# Patient Record
Sex: Female | Born: 1965 | Race: White | Marital: Single | Smoking: Former smoker
Health system: Southern US, Community
[De-identification: ages and names within clinical notes are randomized; demographics above are authoritative.]

## PROBLEM LIST (undated history)

## (undated) DIAGNOSIS — F329 Major depressive disorder, single episode, unspecified: Secondary | ICD-10-CM

## (undated) DIAGNOSIS — F419 Anxiety disorder, unspecified: Secondary | ICD-10-CM

## (undated) DIAGNOSIS — R011 Cardiac murmur, unspecified: Secondary | ICD-10-CM

## (undated) DIAGNOSIS — F32A Depression, unspecified: Secondary | ICD-10-CM

## (undated) DIAGNOSIS — M1990 Unspecified osteoarthritis, unspecified site: Secondary | ICD-10-CM

## (undated) HISTORY — PX: OTHER SURGICAL HISTORY: SHX169

## (undated) HISTORY — PX: WISDOM TOOTH EXTRACTION: SHX21

## (undated) HISTORY — DX: Unspecified osteoarthritis, unspecified site: M19.90

## (undated) HISTORY — DX: Cardiac murmur, unspecified: R01.1

## (undated) HISTORY — DX: Depression, unspecified: F32.A

## (undated) HISTORY — DX: Anxiety disorder, unspecified: F41.9

---
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
## 2005-11-17 HISTORY — PX: AUGMENTATION MAMMAPLASTY: SUR837

## 2011-01-27 ENCOUNTER — Other Ambulatory Visit: Payer: Self-pay | Admitting: Internal Medicine

## 2011-01-27 DIAGNOSIS — Z1231 Encounter for screening mammogram for malignant neoplasm of breast: Secondary | ICD-10-CM

## 2011-01-31 ENCOUNTER — Ambulatory Visit
Admission: RE | Admit: 2011-01-31 | Discharge: 2011-01-31 | Disposition: A | Discharge status: Home / Self Care | Payer: 59 | Source: Ambulatory Visit | Attending: Internal Medicine | Admitting: Internal Medicine

## 2011-01-31 DIAGNOSIS — Z1231 Encounter for screening mammogram for malignant neoplasm of breast: Secondary | ICD-10-CM

## 2011-02-04 ENCOUNTER — Ambulatory Visit: Payer: Self-pay

## 2017-09-01 ENCOUNTER — Other Ambulatory Visit: Payer: Self-pay | Admitting: Nurse Practitioner

## 2017-09-01 DIAGNOSIS — Z1231 Encounter for screening mammogram for malignant neoplasm of breast: Secondary | ICD-10-CM

## 2017-09-18 ENCOUNTER — Ambulatory Visit: Payer: Self-pay

## 2017-11-17 HISTORY — PX: OTHER SURGICAL HISTORY: SHX169

## 2019-04-21 ENCOUNTER — Encounter: Payer: Self-pay | Admitting: Gastroenterology

## 2019-05-05 ENCOUNTER — Ambulatory Visit: Payer: 59

## 2019-05-05 ENCOUNTER — Other Ambulatory Visit: Payer: Self-pay

## 2019-05-05 VITALS — Ht 67.0 in | Wt 132.0 lb

## 2019-05-05 DIAGNOSIS — Z1211 Encounter for screening for malignant neoplasm of colon: Secondary | ICD-10-CM

## 2019-05-05 MED ORDER — NA SULFATE-K SULFATE-MG SULF 17.5-3.13-1.6 GM/177ML PO SOLN: 1.0000 | Freq: Once | ORAL | 0 refills | Status: AC

## 2019-05-05 NOTE — Progress Notes (Signed)
No egg or soy allergy known to patient  No issues with past sedation with any surgeries  or procedures, no intubation problems  No diet pills per patient No home 02 use per patient  No blood thinners per patient  Pt denies issues with constipation  No A fib or A flutter  EMMI video sent to pt's e mail  

## 2019-05-13 ENCOUNTER — Other Ambulatory Visit: Payer: Self-pay | Admitting: Internal Medicine

## 2019-05-13 DIAGNOSIS — Z1231 Encounter for screening mammogram for malignant neoplasm of breast: Secondary | ICD-10-CM

## 2019-05-19 ENCOUNTER — Encounter: Payer: 59 | Admitting: Gastroenterology

## 2019-05-24 ENCOUNTER — Encounter: Payer: Self-pay | Admitting: Gastroenterology

## 2019-05-30 ENCOUNTER — Telehealth: Payer: Self-pay | Admitting: Gastroenterology

## 2019-05-30 NOTE — Telephone Encounter (Signed)

## 2019-05-31 ENCOUNTER — Other Ambulatory Visit: Payer: Self-pay

## 2019-05-31 ENCOUNTER — Ambulatory Visit (AMBULATORY_SURGERY_CENTER): Payer: 59 | Admitting: Gastroenterology

## 2019-05-31 ENCOUNTER — Encounter: Payer: Self-pay | Admitting: Gastroenterology

## 2019-05-31 VITALS — BP 120/87 | HR 66 | Temp 99.1°F | Resp 16 | Ht 67.0 in | Wt 132.0 lb

## 2019-05-31 DIAGNOSIS — Z1211 Encounter for screening for malignant neoplasm of colon: Secondary | ICD-10-CM | POA: Diagnosis present

## 2019-05-31 MED ORDER — SODIUM CHLORIDE 0.9 % IV SOLN: 500.0000 mL | Freq: Once | INTRAVENOUS | Status: DC

## 2019-05-31 NOTE — Op Note (Signed)
[facility] Patient Name: Whitney Mosley Key Procedure Date: 05/31/2019 7:35 AM MRN: [id number] Endoscopist: Lynann Bolognaajesh Kirstein Baxley , MD Age: 53 Referring MD:  Date of Birth: 1966-11-13 Gender: Female Account #: [id number] Procedure:                Colonoscopy Indications:              Screening for colorectal malignant neoplasm Medicines:                Monitored Anesthesia Care Procedure:                Pre-Anesthesia Assessment:                           - Prior to the procedure, a History and Physical                            was performed, and patient medications and                            allergies were reviewed. The patient's tolerance of                            previous anesthesia was also reviewed. The risks                            and benefits of the procedure and the sedation                            options and risks were discussed with the patient.                            All questions were answered, and informed consent                            was obtained. Prior Anticoagulants: The patient has                            taken no previous anticoagulant or antiplatelet                            agents. ASA Grade Assessment: II - A patient with                            mild systemic disease. After reviewing the risks                            and benefits, the patient was deemed in                            satisfactory condition to undergo the procedure.                           After obtaining informed consent, the colonoscope  was passed under direct vision. Throughout the                            procedure, the patient's blood pressure, pulse, and                            oxygen saturations were monitored continuously. The                            Colonoscope was introduced through the anus and                            advanced to the 2 cm into the ileum. The                            colonoscopy was performed  without difficulty. The                            patient tolerated the procedure well. The quality                            of the bowel preparation was good. The terminal                            ileum, ileocecal valve, appendiceal orifice, and                            rectum were photographed. Scope In: 7:45:01 AM Scope Out: 7:58:58 AM Scope Withdrawal Time: 0 hours 8 minutes 50 seconds  Total Procedure Duration: 0 hours 13 minutes 57 seconds  Findings:                 A few small-mouthed diverticula were found in the                            sigmoid colon and ascending colon. There was no                            evidence of diverticular bleeding.                           Non-bleeding internal hemorrhoids were found during                            retroflexion. The hemorrhoids were small.                           The terminal ileum appeared normal.                           The exam was otherwise without abnormality on                            direct and retroflexion views. Complications:  No immediate complications. Estimated Blood Loss:     Estimated blood loss: none. Impression:               -Mild pancolonic diverticulosis.                           -Otherwise normal colonoscopy to TI. Recommendation:           - Patient has a contact number available for                            emergencies. The signs and symptoms of potential                            delayed complications were discussed with the                            patient. Return to normal activities tomorrow.                            Written discharge instructions were provided to the                            patient.                           - Resume previous diet.                           - Continue present medications.                           - Repeat colonoscopy in 10 years for screening                            purposes. Earlier, if with any new problems or if                             there is any change in family history.                           - Return to GI clinic PRN. Lynann Bolognaajesh Miel Wisener, MD 05/31/2019 8:04:54 AM This report has been signed electronically.

## 2019-05-31 NOTE — Progress Notes (Signed)
To PACU, VSS. Report to [email or web address] 

## 2019-05-31 NOTE — Progress Notes (Addendum)
No problems noted in the recovery room. Maw  Pt had 7:30 procedure and her boyfriend was the 8:00 procedure.  Same driver. maw

## 2019-05-31 NOTE — Progress Notes (Signed)
[place]  Pt's states no medical or surgical changes since previsit or office visit.  Pt states propofol makes her vomit

## 2019-05-31 NOTE — Patient Instructions (Signed)
YOU HAD AN ENDOSCOPIC PROCEDURE TODAY AT [facility]:   Refer to the procedure report that was given to you for any specific questions about what was found during the examination.  If the procedure report does not answer your questions, please call your gastroenterologist to clarify.  If you requested that your care partner not be given the details of your procedure findings, then the procedure report has been included in a sealed envelope for you to review at your convenience later.  YOU SHOULD EXPECT: Some feelings of bloating in the abdomen. Passage of more gas than usual.  Walking can help get rid of the air that was put into your GI tract during the procedure and reduce the bloating. If you had a lower endoscopy (such as a colonoscopy or flexible sigmoidoscopy) you may notice spotting of blood in your stool or on the toilet paper. If you underwent a bowel prep for your procedure, you may not have a normal bowel movement for a few days.  Please Note:  You might notice some irritation and congestion in your nose or some drainage.  This is from the oxygen used during your procedure.  There is no need for concern and it should clear up in a day or so.  SYMPTOMS TO REPORT IMMEDIATELY:   Following lower endoscopy (colonoscopy or flexible sigmoidoscopy):  Excessive amounts of blood in the stool  Significant tenderness or worsening of abdominal pains  Swelling of the abdomen that is new, acute  Fever of 100F or higher   For urgent or emergent issues, a gastroenterologist can be reached at any hour by calling [phone].   DIET:  We do recommend a small meal at first, but then you may proceed to your regular diet.  Drink plenty of fluids but you should avoid alcoholic beverages for 24 hours.  ACTIVITY:  You should plan to take it easy for the rest of today and you should NOT DRIVE or use heavy machinery until tomorrow (because of the sedation medicines used during the test).     FOLLOW UP: Our staff will call the number listed on your records 48-72 hours following your procedure to check on you and address any questions or concerns that you may have regarding the information given to you following your procedure. If we do not reach you, we will leave a message.  We will attempt to reach you two times.  During this call, we will ask if you have developed any symptoms of COVID 19. If you develop any symptoms (ie: fever, flu-like symptoms, shortness of breath, cough etc.) before then, please call [phone].  If you test positive for Covid 19 in the 2 weeks post procedure, please call and report this information to [place].    If any biopsies were taken you will be contacted by phone or by letter within the next 1-3 weeks.  Please call us at [phone] if you have not heard about the biopsies in 3 weeks.    SIGNATURES/CONFIDENTIALITY: You and/or your care partner have signed paperwork which will be entered into your electronic medical record.  These signatures attest to the fact that that the information above on your After Visit Summary has been reviewed and is understood.  Full responsibility of the confidentiality of this discharge information lies with you and/or your care-partner.    Handouts were given to your care partner on diverticulosis and hemorrhoids. You may resume your current medications today. Repeat screening colonoscopy in 10 years.  Please call if any questions or concerns.

## 2019-06-02 ENCOUNTER — Telehealth: Payer: Self-pay | Admitting: *Deleted

## 2019-06-02 NOTE — Telephone Encounter (Signed)
  Follow up Call-  Call back number 05/31/2019  Post procedure Call Back phone  # [phone]  Permission to leave phone message Yes  Some recent data might be hidden     Patient questions:  Do you have a fever, pain , or abdominal swelling? No. Pain Score  0 *  Have you tolerated food without any problems? Yes.    Have you been able to return to your normal activities? Yes.    Do you have any questions about your discharge instructions: Diet   No. Medications  No. Follow up visit  No.  Do you have questions or concerns about your Care? No.  Actions: * If pain score is 4 or above: No action needed, pain <4.  1. Have you developed a fever since your procedure? no  2.   Have you had an respiratory symptoms (SOB or cough) since your procedure? no  3.   Have you tested positive for COVID 19 since your procedure no  4.   Have you had any family members/close contacts diagnosed with the COVID 19 since your procedure?  no   If yes to any of these questions please route to Joylene John, RN and Alphonsa Gin, Therapist, sports.

## 2019-06-02 NOTE — Telephone Encounter (Signed)
  Follow up Call-  Call back number 05/31/2019  Post procedure Call Back phone  # [phone]  Permission to leave phone message Yes  Some recent data might be hidden     Patient questions:  Message left to call us if necessary.

## 2019-06-28 ENCOUNTER — Other Ambulatory Visit: Payer: Self-pay

## 2019-06-28 ENCOUNTER — Ambulatory Visit
Admission: RE | Admit: 2019-06-28 | Discharge: 2019-06-28 | Disposition: A | Discharge status: Home / Self Care | Payer: 59 | Source: Ambulatory Visit | Attending: Internal Medicine | Admitting: Internal Medicine

## 2019-06-28 DIAGNOSIS — Z1231 Encounter for screening mammogram for malignant neoplasm of breast: Secondary | ICD-10-CM

## 2020-05-16 ENCOUNTER — Other Ambulatory Visit: Payer: Self-pay | Admitting: Internal Medicine

## 2020-05-16 DIAGNOSIS — Z1231 Encounter for screening mammogram for malignant neoplasm of breast: Secondary | ICD-10-CM

## 2020-07-06 ENCOUNTER — Ambulatory Visit
Admission: RE | Admit: 2020-07-06 | Discharge: 2020-07-06 | Disposition: A | Discharge status: Home / Self Care | Payer: 59 | Source: Ambulatory Visit | Attending: Internal Medicine | Admitting: Internal Medicine

## 2020-07-06 ENCOUNTER — Other Ambulatory Visit: Payer: Self-pay

## 2020-07-06 DIAGNOSIS — Z1231 Encounter for screening mammogram for malignant neoplasm of breast: Secondary | ICD-10-CM

## 2021-05-28 ENCOUNTER — Other Ambulatory Visit: Payer: Self-pay | Admitting: Internal Medicine

## 2021-05-28 DIAGNOSIS — Z1231 Encounter for screening mammogram for malignant neoplasm of breast: Secondary | ICD-10-CM

## 2021-07-23 ENCOUNTER — Ambulatory Visit: Payer: 59

## 2022-01-03 ENCOUNTER — Ambulatory Visit
Admission: RE | Admit: 2022-01-03 | Discharge: 2022-01-03 | Disposition: A | Discharge status: Home / Self Care | Payer: BC Managed Care – PPO | Source: Ambulatory Visit | Attending: Internal Medicine | Admitting: Internal Medicine

## 2022-01-03 DIAGNOSIS — Z1231 Encounter for screening mammogram for malignant neoplasm of breast: Secondary | ICD-10-CM

## 2022-11-27 ENCOUNTER — Other Ambulatory Visit: Payer: Self-pay | Admitting: Internal Medicine

## 2022-11-27 DIAGNOSIS — Z1231 Encounter for screening mammogram for malignant neoplasm of breast: Secondary | ICD-10-CM

## 2023-01-19 ENCOUNTER — Ambulatory Visit: Payer: BC Managed Care – PPO

## 2023-03-11 ENCOUNTER — Ambulatory Visit: Payer: BC Managed Care – PPO

## 2023-04-15 ENCOUNTER — Ambulatory Visit: Payer: BC Managed Care – PPO

## 2023-05-24 IMAGING — MG DIGITAL SCREENING BREAST BILAT IMPLANT W/ TOMO W/ CAD
9 of 12 series · 9 of 28 positions shown · non-contrast
Comparison: Previous exam(s).

CLINICAL DATA: Screening.

EXAM:
DIGITAL SCREENING BILATERAL MAMMOGRAM WITH IMPLANTS, CAD AND
TOMOSYNTHESIS
TECHNIQUE: Bilateral screening digital craniocaudal and mediolateral oblique
mammograms were obtained. Bilateral screening digital breast
tomosynthesis was performed. The images were evaluated with
computer-aided detection. Standard and/or implant displaced views
were performed.

[L MLO]
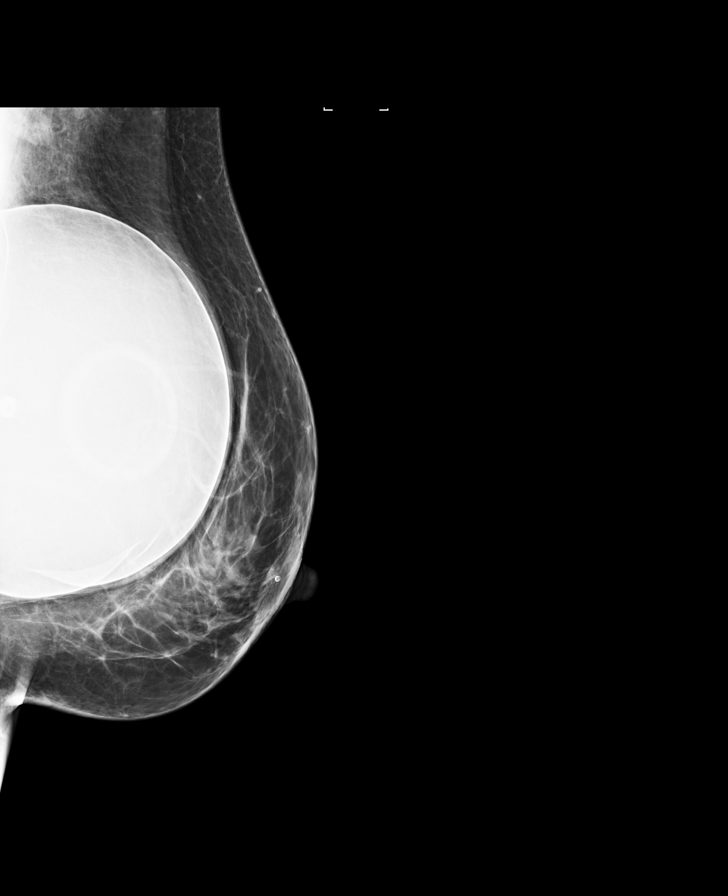

[R CC]
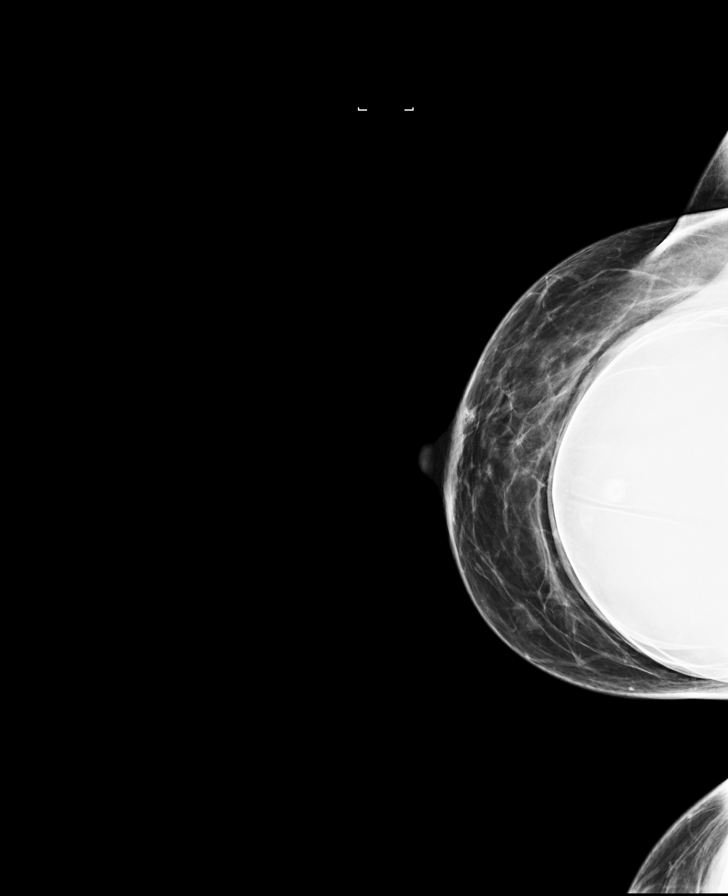

[L CC]
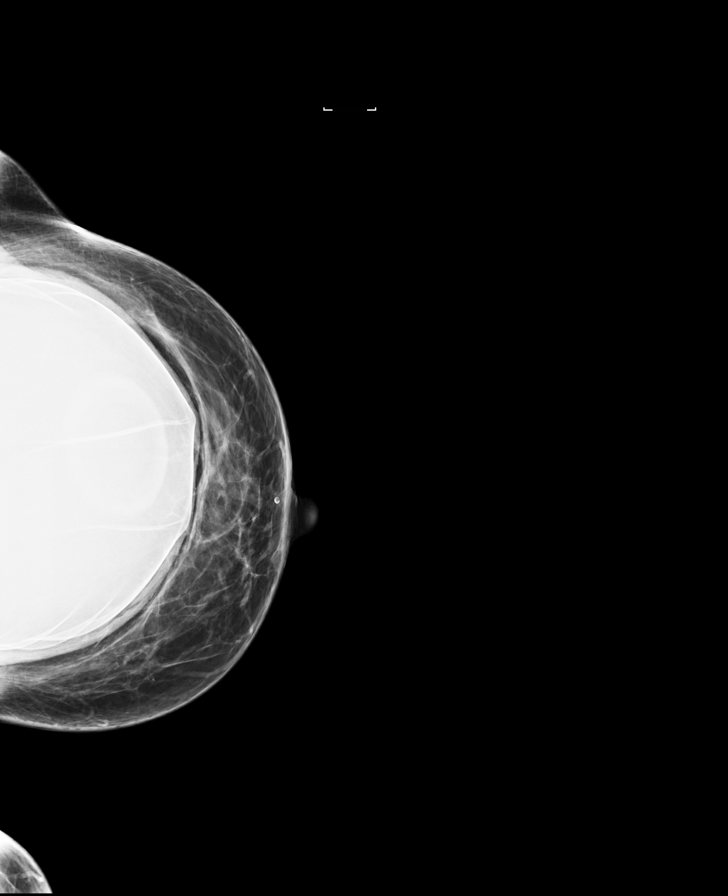

[R MLO]
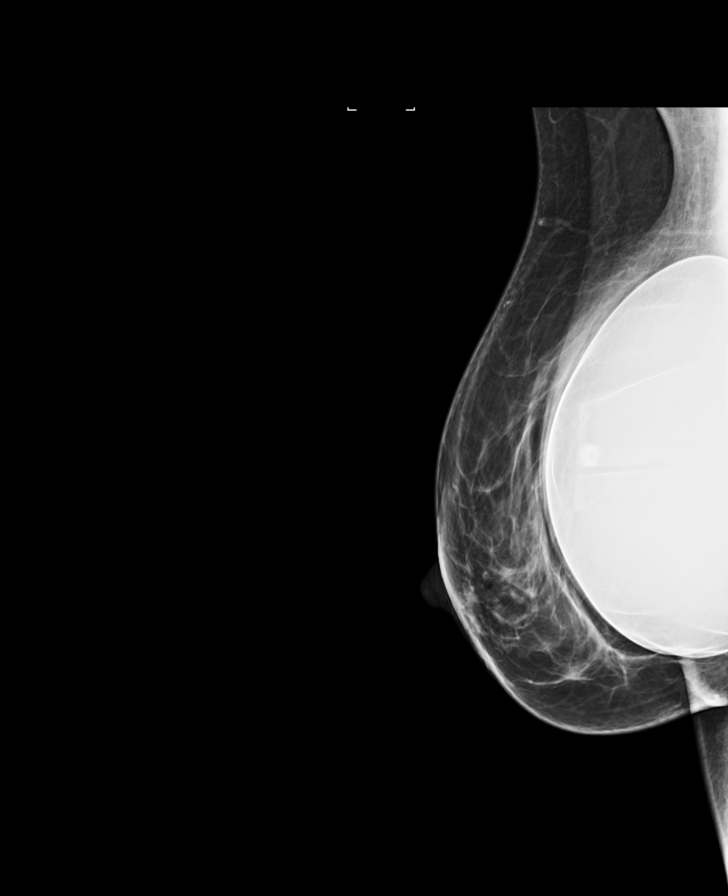

[L MLO synth-2D]
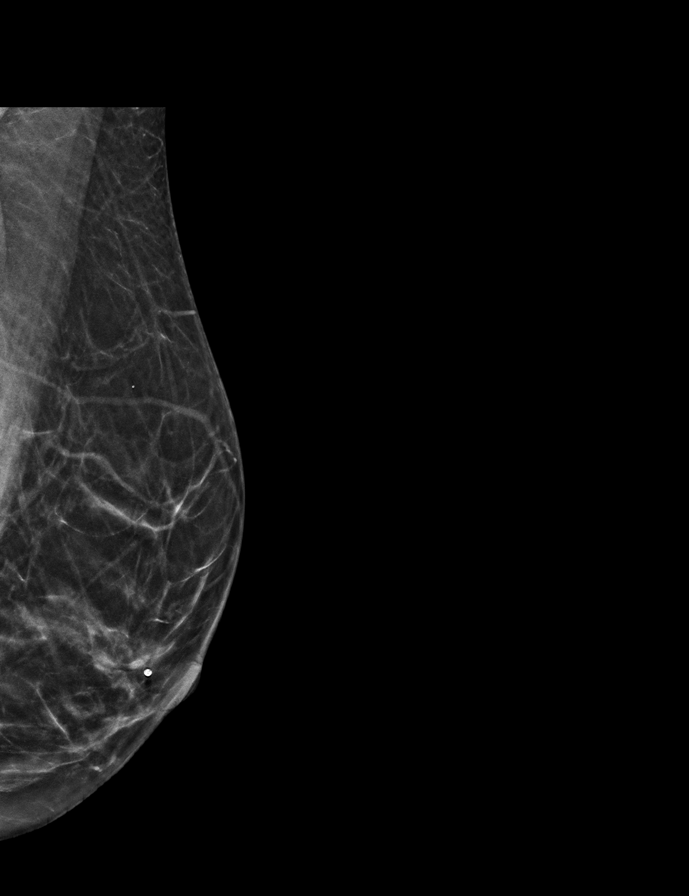

[R MLO synth-2D]
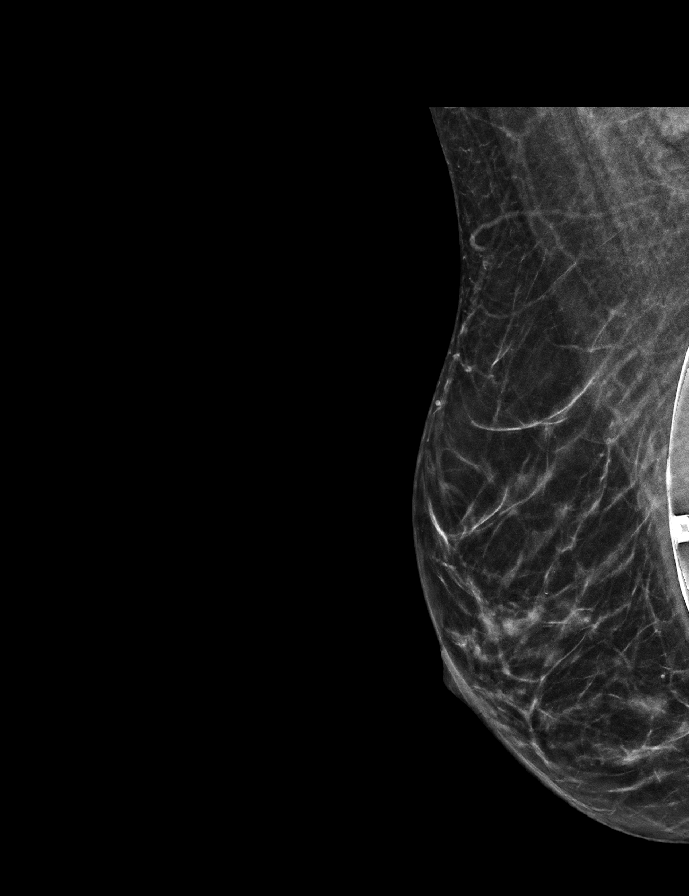

[L CC synth-2D]
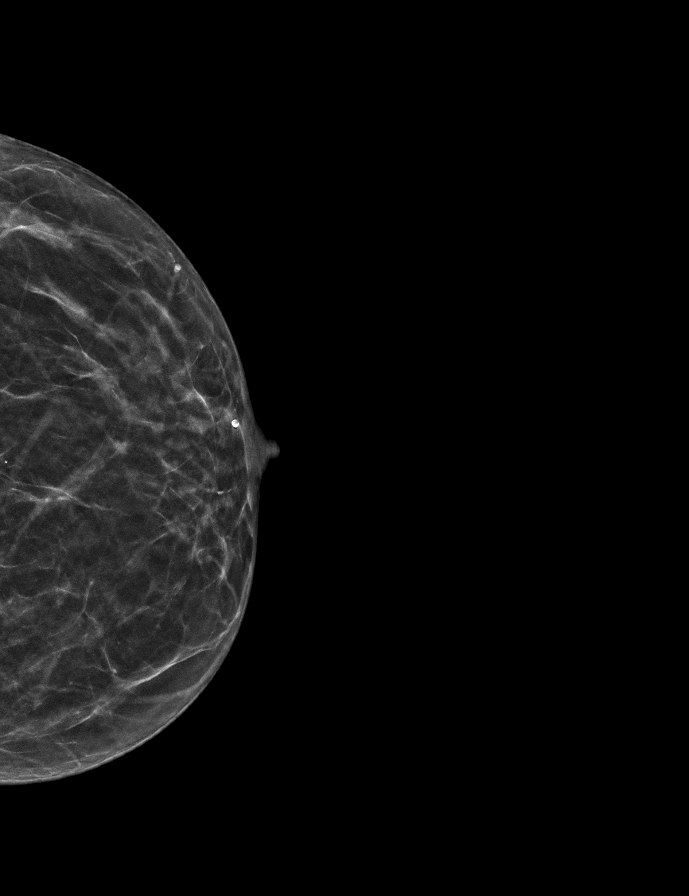

[R CC synth-2D]
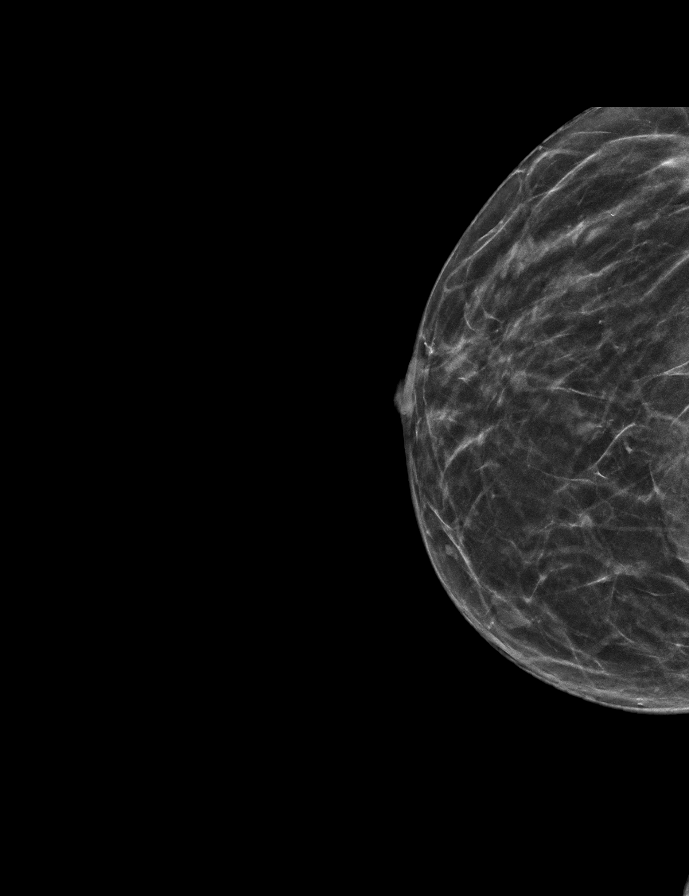

[L CCID BREAST TOMOSYNTHESIS IMAGE tomo · tomo slice 21/41.0]
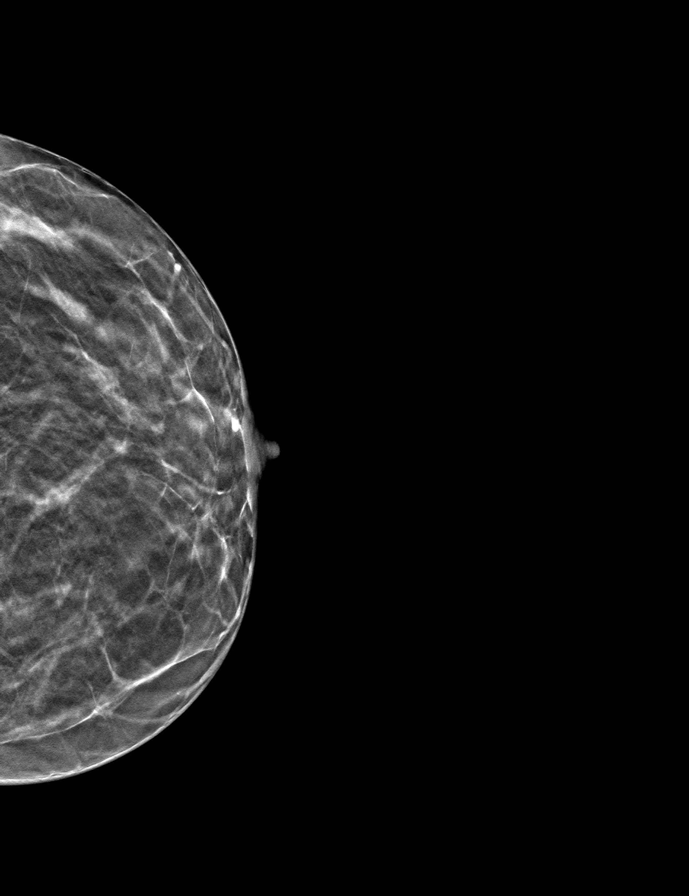

[9 of 28 positions shown; findings below may reference images not displayed]

ACR Breast Density Category b: There are scattered areas of
fibroglandular density.
FINDINGS: The patient has retropectoral implants. There are no findings
suspicious for malignancy.
IMPRESSION: No mammographic evidence of malignancy. A result letter of this
screening mammogram will be mailed directly to the patient.

RECOMMENDATION:
Screening mammogram in one year. (Code:SE-S-JMG)

BI-RADS CATEGORY  1:  Negative.
